# Patient Record
Sex: Male | Born: 1995 | Race: Black or African American | Hispanic: No | Marital: Single
Health system: Southern US, Community
[De-identification: ages and names within clinical notes are randomized; demographics above are authoritative.]

---
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
## 2018-04-25 ENCOUNTER — Encounter (HOSPITAL_COMMUNITY): Payer: Self-pay | Admitting: Emergency Medicine

## 2018-04-25 ENCOUNTER — Ambulatory Visit (HOSPITAL_COMMUNITY)
Admission: EM | Admit: 2018-04-25 | Discharge: 2018-04-25 | Disposition: A | Discharge status: Home / Self Care | Payer: BLUE CROSS/BLUE SHIELD | Attending: Family Medicine | Admitting: Family Medicine

## 2018-04-25 ENCOUNTER — Ambulatory Visit (INDEPENDENT_AMBULATORY_CARE_PROVIDER_SITE_OTHER): Payer: BLUE CROSS/BLUE SHIELD

## 2018-04-25 DIAGNOSIS — R195 Other fecal abnormalities: Secondary | ICD-10-CM | POA: Diagnosis not present

## 2018-04-25 DIAGNOSIS — K5900 Constipation, unspecified: Secondary | ICD-10-CM

## 2018-04-25 DIAGNOSIS — K5909 Other constipation: Secondary | ICD-10-CM | POA: Diagnosis not present

## 2018-04-25 MED ORDER — POLYETHYLENE GLYCOL 3350 17 GM/SCOOP PO POWD: 17.0000 g | Freq: Every day | ORAL | 0 refills | Status: AC

## 2018-04-25 NOTE — Discharge Instructions (Addendum)
Increase water and fiber in your diet.  Miralax daily to promote regular bowel movements.  You may continue with daily stool softener but try to avoid regular use of other laxatives. Please establish with a primary care provider for long term management of your constipation. If develop increased pain, fevers, nausea or vomiting, blood in stool please go to Er.

## 2018-04-25 NOTE — ED Triage Notes (Signed)
Pt here for constipation x 1 week; pt sts feels urge to have BM

## 2018-04-25 NOTE — ED Provider Notes (Signed)
[facility]    CSN: [id number] Arrival date & time: 04/25/18  1304     History   Chief Complaint Chief Complaint  Patient presents with  . Constipation    HPI Juan Grant is a 22 y.o. male.   Juan Grant presents with complaints of constipation for the past week. States he feels he has not been able to pass adequate amount of stool. No fevers. Has had occasional abdominal cramping but overall has not had pain. No nausea or vomiting. Eating but somewhat of decreased appetite. States he did have a large BM today after taking laxatives yesterday as well as colace, but feels that it was still inadequate. States he feels pressure near his tailbone as if he still needs to pass more stool. Went to another clinic 1 week ago and had rectal exam, no hemorrhoids per patient. No blood. Normal urination. Has not taken any medications today. States he has had issues with constipation in the past. Without contributing medical history.     ROS per HPI.      History reviewed. No pertinent past medical history.  There are no active problems to display for this patient.   History reviewed. No pertinent surgical history.     Home Medications    Prior to Admission medications   Medication Sig Start Date End Date Taking? Authorizing Provider  clindamycin-tretinoin Salli Quarry(ZIANA) gel Apply 1 application topically at bedtime.   03/03/11   [provider]  polyethylene glycol powder (GLYCOLAX/MIRALAX) powder Take 17 g by mouth daily. 04/25/18   Georgetta HaberBurky, Natalie B, NP    Family History History reviewed. No pertinent family history.  Social History Social History   Tobacco Use  . Smoking status: Passive Smoke Exposure - Never Smoker  . Smokeless tobacco: Never Used  Substance Use Topics  . Alcohol use: No  . Drug use: No     Allergies   Patient has no known allergies.   Review of Systems Review of Systems   Physical Exam Triage Vital Signs ED Triage Vitals  [04/25/18 1359]  Enc Vitals Group     BP 117/76     Pulse Rate 72     Resp 16     Temp 98.4 F (36.9 C)     Temp Source Oral     SpO2 100 %     Weight      Height      Head Circumference      Peak Flow      Pain Score      Pain Loc      Pain Edu?      Excl. in GC?    No data found.  Updated Vital Signs BP 117/76 (BP Location: Right Arm)   Pulse 72   Temp 98.4 F (36.9 C) (Oral)   Resp 16   SpO2 100%   Visual Acuity Right Eye Distance:   Left Eye Distance:   Bilateral Distance:    Right Eye Near:   Left Eye Near:    Bilateral Near:     Physical Exam  Constitutional: He is oriented to person, place, and time. He appears well-developed and well-nourished.  Cardiovascular: Normal rate and regular rhythm.  Pulmonary/Chest: Effort normal and breath sounds normal.  Abdominal: Soft. Bowel sounds are normal. He exhibits no distension. There is no tenderness. There is no rigidity, no rebound, no guarding, no CVA tenderness, no tenderness at McBurney's point and negative Murphy's sign.  Neurological: He is alert and oriented  to person, place, and time.  Skin: Skin is warm and dry.     UC Treatments / Results  Labs (all labs ordered are listed, but only abnormal results are displayed) Labs Reviewed - No data to display  EKG None  Radiology Dg Abd 1 View  Result Date: 04/25/2018 CLINICAL DATA:  History of constipation EXAM: ABDOMEN - 1 VIEW COMPARISON:  None. FINDINGS: Scattered large and small bowel gas is noted. A small amount of retained fecal material is noted in what appears to be is sigmoid colon. No obstructive changes are seen. No bony abnormality is noted. No free air is noted. IMPRESSION: Mild retained fecal material within the sigmoid colon. No obstructive changes are noted. Electronically Signed   By: Alcide Clever M.D.   On: 04/25/2018 14:54    Procedures Procedures (including critical care time)  Medications Ordered in UC Medications - No data to  display  Initial Impression / Assessment and Plan / UC Course  I have reviewed the triage vital signs and the nursing notes.  Pertinent labs & imaging results that were available during my care of the patient were reviewed by me and considered in my medical decision making (see chart for details).     Complaints of constipation, has been passing stool. Eating, drinking. No nausea vomiting or abdominal pain. Non toxic in appearance. Afebrile. Mild retained stool on xray. Had a BM today. miralax recommended. Stool softener daily may be used. Increase fiber and fluid in diet. Return precautions provided. Patient verbalized understanding and agreeable to plan.  .  Final Clinical Impressions(s) / UC Diagnoses   Final diagnoses:  Constipation, unspecified constipation type     Discharge Instructions     Increase water and fiber in your diet.  Miralax daily to promote regular bowel movements.  You may continue with daily stool softener but try to avoid regular use of other laxatives. Please establish with a primary care provider for long term management of your constipation. If develop increased pain, fevers, nausea or vomiting, blood in stool please go to Er.     ED Prescriptions    Medication Sig Dispense Auth. Provider   polyethylene glycol powder (GLYCOLAX/MIRALAX) powder Take 17 g by mouth daily. 255 g Georgetta Haber, NP     Controlled Substance Prescriptions [place] Controlled Substance Registry consulted? Not Applicable   Georgetta Haber, NP 04/25/18 1506

## 2019-09-12 ENCOUNTER — Other Ambulatory Visit: Payer: Self-pay | Admitting: Physician Assistant

## 2019-09-12 DIAGNOSIS — R319 Hematuria, unspecified: Secondary | ICD-10-CM

## 2019-09-12 DIAGNOSIS — R109 Unspecified abdominal pain: Secondary | ICD-10-CM

## 2019-09-13 ENCOUNTER — Ambulatory Visit
Admission: RE | Admit: 2019-09-13 | Discharge: 2019-09-13 | Disposition: A | Discharge status: Home / Self Care | Payer: BLUE CROSS/BLUE SHIELD | Source: Ambulatory Visit | Attending: Physician Assistant | Admitting: Physician Assistant

## 2019-09-13 DIAGNOSIS — R109 Unspecified abdominal pain: Secondary | ICD-10-CM

## 2019-09-13 DIAGNOSIS — R319 Hematuria, unspecified: Secondary | ICD-10-CM

## 2019-09-21 ENCOUNTER — Other Ambulatory Visit: Payer: BLUE CROSS/BLUE SHIELD

## 2020-05-20 IMAGING — CT CT ABD-PELV W/O CM
2 of 5 series · 12 of 46 positions shown, 14 images · non-contrast
Comparison: None.

CLINICAL DATA: Right flank pain and hematuria.

EXAM:
CT ABDOMEN AND PELVIS WITHOUT CONTRAST
TECHNIQUE: Multidetector CT imaging of the abdomen and pelvis was performed
following the standard protocol without IV contrast.

[Series 2: renal stone 5.00 br60 s3 axial · axial · 0.50mm/px · z∈[+1336,+1676]mm · 9 of 82 slices shown, 11 images]
[im 7/82  soft-tissue]
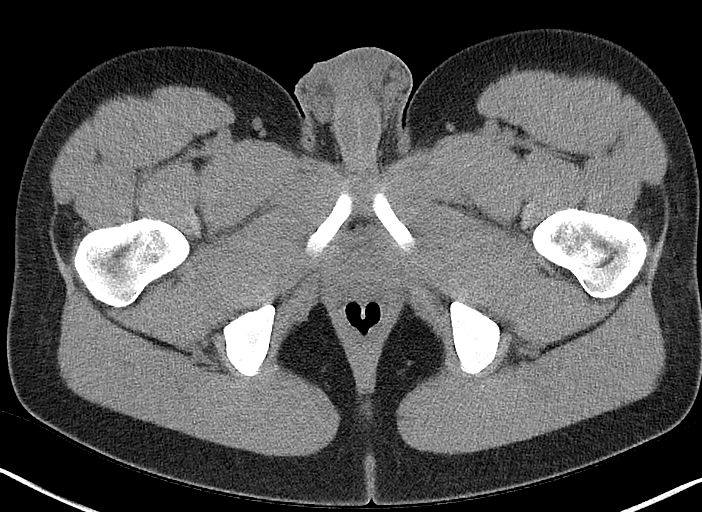
[im 7/82  bone]
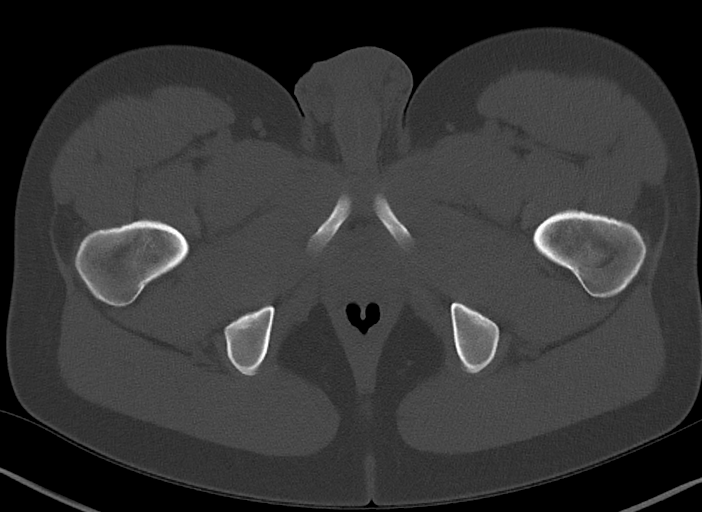
[im 19/82  soft-tissue]
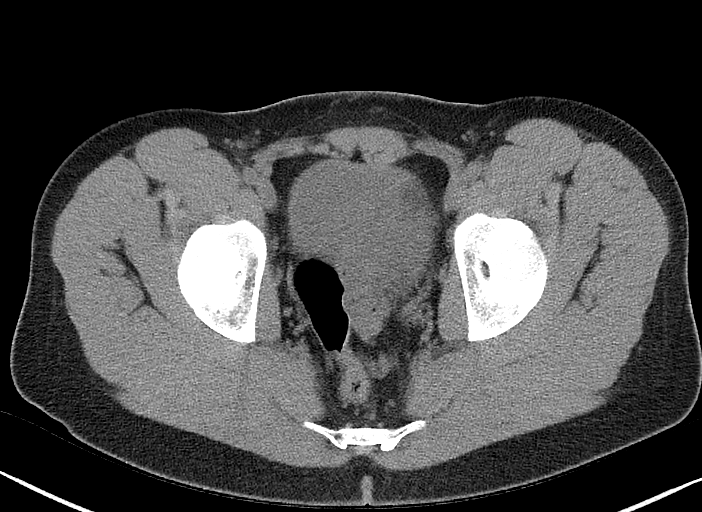
[im 25/82  soft-tissue]
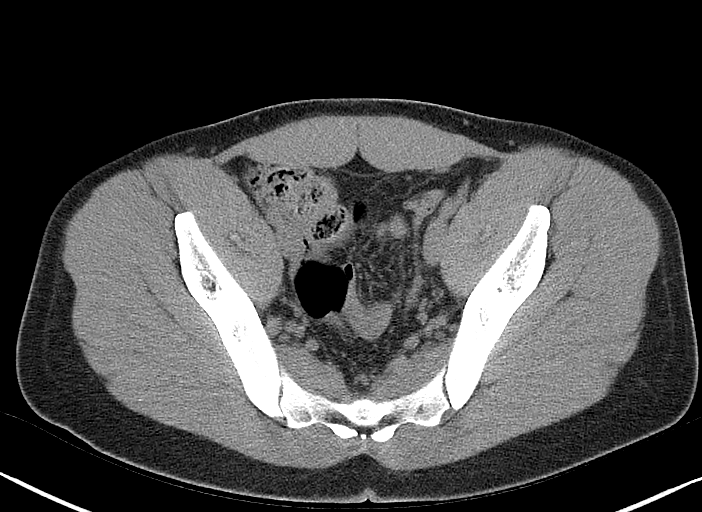
[im 32/82  soft-tissue]
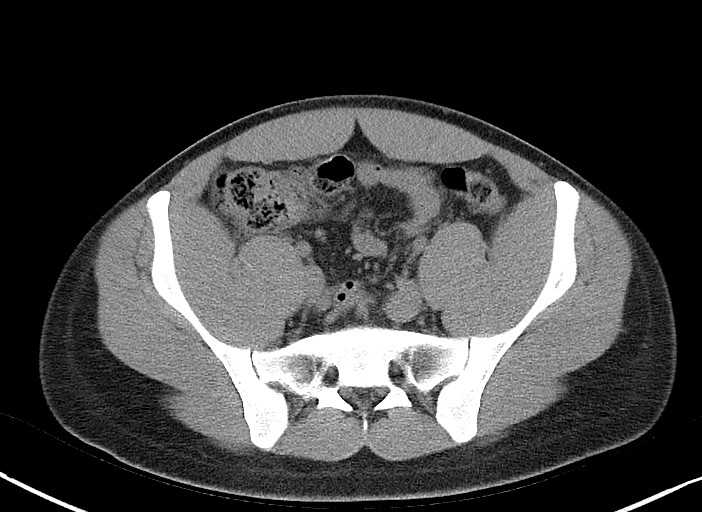
[im 44/82  soft-tissue]
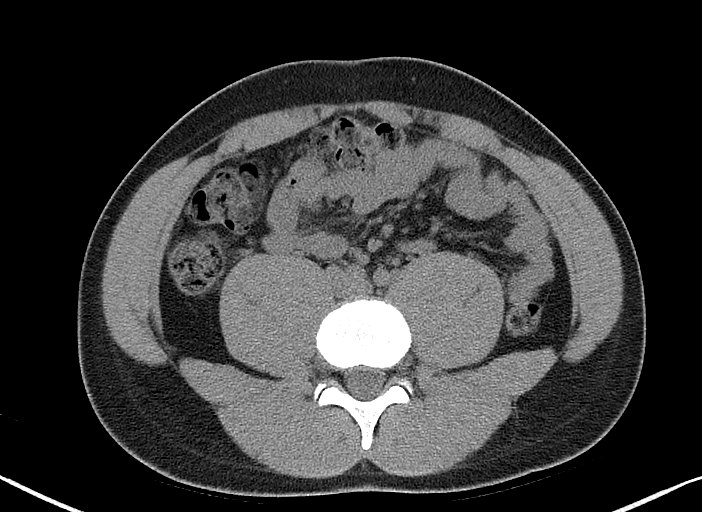
[im 50/82  soft-tissue]
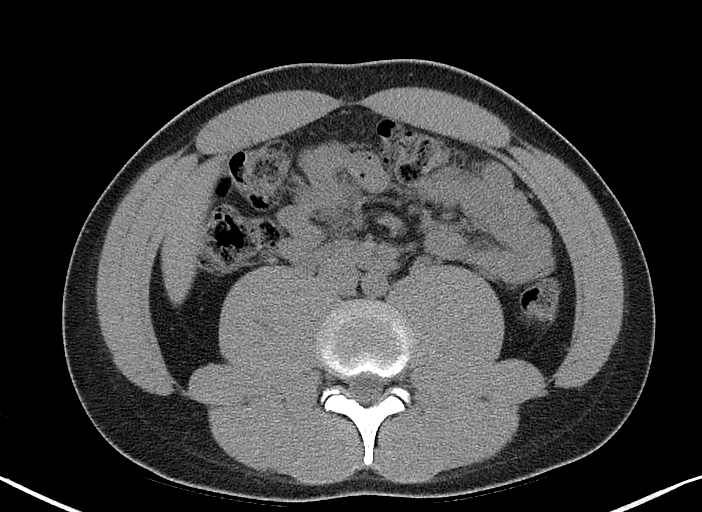
[im 57/82  soft-tissue]
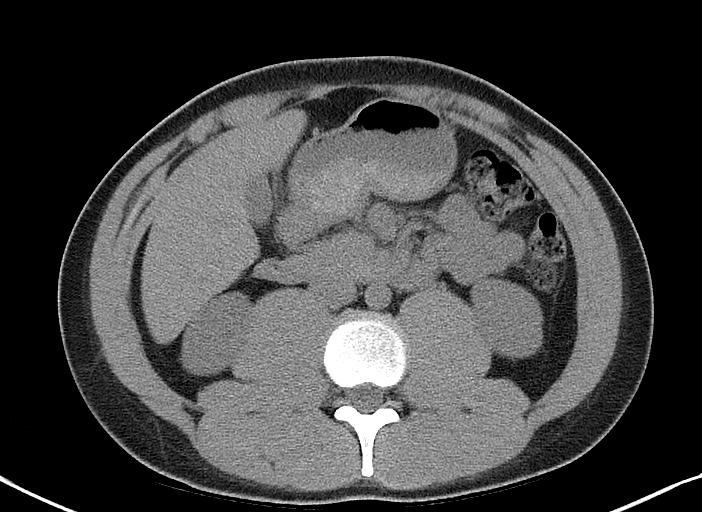
[im 69/82  soft-tissue]
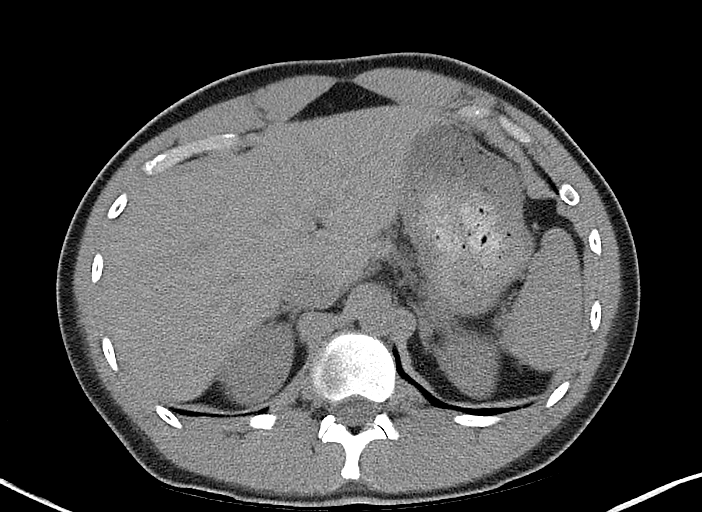
[im 75/82  soft-tissue]
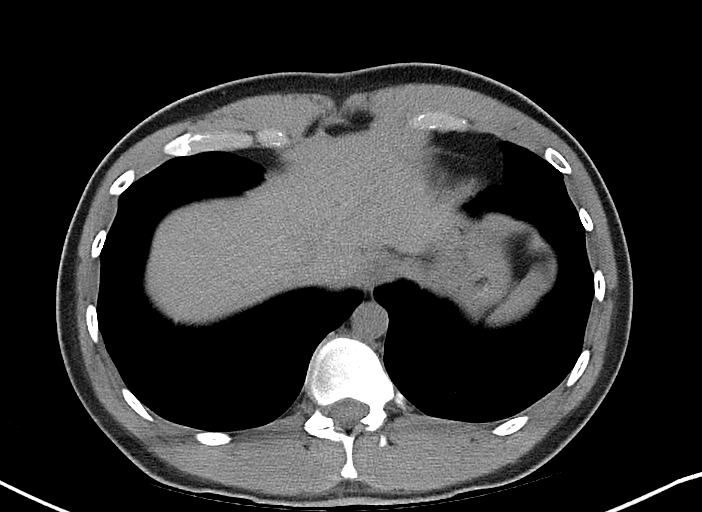
[im 75/82  bone]
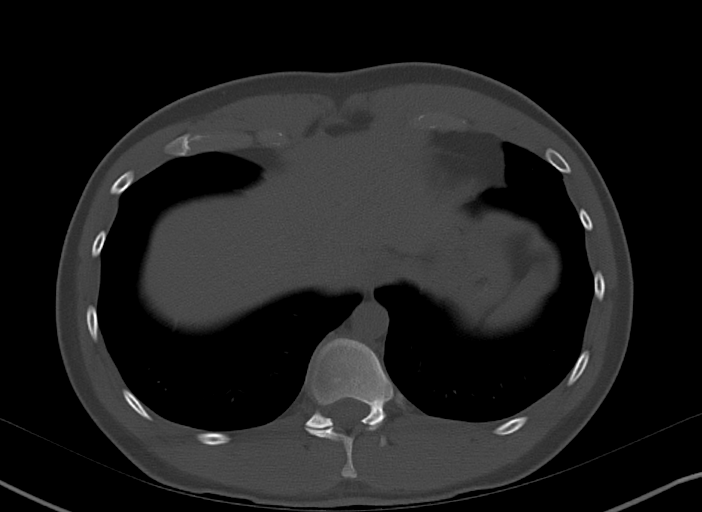

[Series 8: renal stone 2.00 br40 s3 cor · coronal · 0.68mm/px · 3 of 127 slices shown]
[im 43/127  soft-tissue]
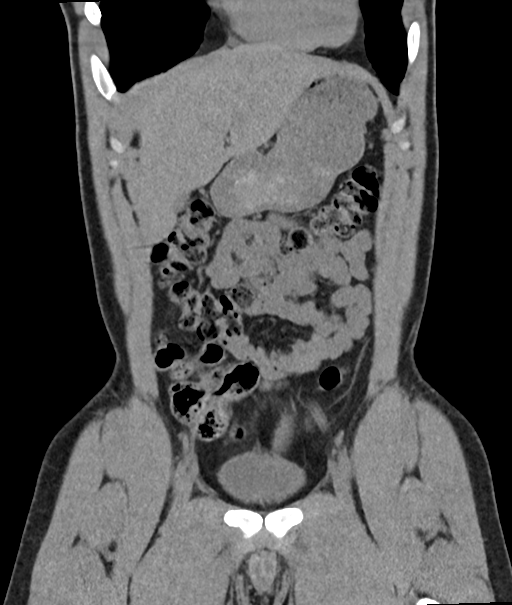
[im 57/127  soft-tissue]
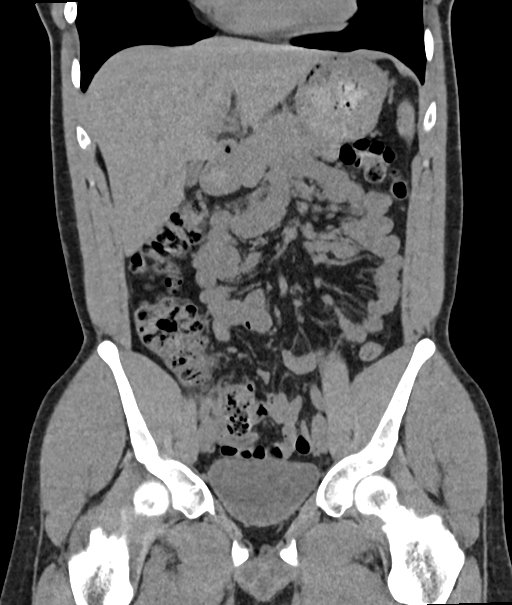
[im 71/127  soft-tissue]
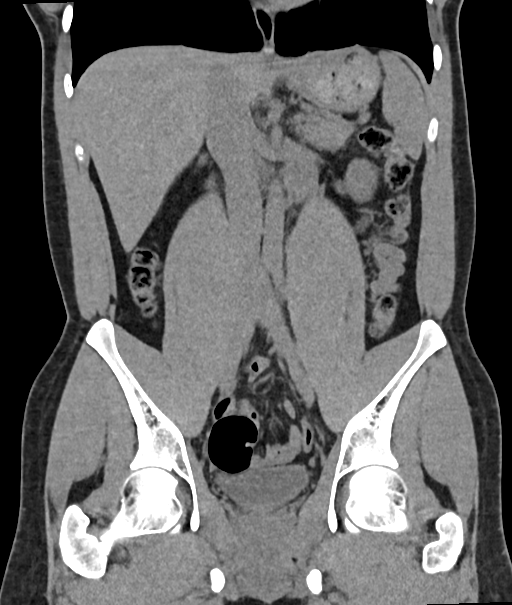

[12 of 46 positions shown; findings below may reference images not displayed]

FINDINGS: Lower chest: No acute findings.

Hepatobiliary: No mass visualized on this unenhanced exam.
Gallbladder is unremarkable. No evidence of biliary ductal
dilatation.

Pancreas: No mass or inflammatory process visualized on this
unenhanced exam.

Spleen:  Within normal limits in size.

Adrenals/Urinary tract: No evidence of urolithiasis or
hydronephrosis. Small fluid attenuation cyst in right kidney.
Unremarkable unopacified urinary bladder.

Stomach/Bowel: No evidence of obstruction, inflammatory process, or
abnormal fluid collections. Normal appendix visualized.

Vascular/Lymphatic: No pathologically enlarged lymph nodes
identified. No evidence of abdominal aortic aneurysm.

Reproductive:  No mass or other significant abnormality.

Other:  None.

Musculoskeletal:  No suspicious bone lesions identified.
IMPRESSION: No evidence of urolithiasis, hydronephrosis, or other acute
findings.

## 2021-11-28 ENCOUNTER — Other Ambulatory Visit: Payer: Self-pay

## 2021-11-28 ENCOUNTER — Emergency Department (HOSPITAL_COMMUNITY)
Admission: EM | Admit: 2021-11-28 | Discharge: 2021-11-28 | Disposition: A | Discharge status: Home / Self Care | Payer: BC Managed Care – PPO | Attending: Emergency Medicine | Admitting: Emergency Medicine

## 2021-11-28 DIAGNOSIS — Y92410 Unspecified street and highway as the place of occurrence of the external cause: Secondary | ICD-10-CM | POA: Diagnosis not present

## 2021-11-28 DIAGNOSIS — S00511A Abrasion of lip, initial encounter: Secondary | ICD-10-CM | POA: Diagnosis not present

## 2021-11-28 DIAGNOSIS — R519 Headache, unspecified: Secondary | ICD-10-CM

## 2021-11-28 DIAGNOSIS — S0993XA Unspecified injury of face, initial encounter: Secondary | ICD-10-CM | POA: Diagnosis present
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
